# Patient Record
Sex: Male | Born: 1985 | Hispanic: Yes | Marital: Single | Smoking: Light tobacco smoker
Health system: Southern US, Community
[De-identification: ages and names within clinical notes are randomized; demographics above are authoritative.]

---

## 2017-08-30 ENCOUNTER — Emergency Department (HOSPITAL_COMMUNITY): Payer: No Typology Code available for payment source

## 2017-08-30 ENCOUNTER — Emergency Department (HOSPITAL_COMMUNITY)
Admission: EM | Admit: 2017-08-30 | Discharge: 2017-08-31 | Disposition: A | Discharge status: Home / Self Care | Payer: No Typology Code available for payment source | Attending: Emergency Medicine | Admitting: Emergency Medicine

## 2017-08-30 ENCOUNTER — Other Ambulatory Visit: Payer: Self-pay

## 2017-08-30 ENCOUNTER — Encounter (HOSPITAL_COMMUNITY): Payer: Self-pay

## 2017-08-30 DIAGNOSIS — Y999 Unspecified external cause status: Secondary | ICD-10-CM | POA: Insufficient documentation

## 2017-08-30 DIAGNOSIS — Y92410 Unspecified street and highway as the place of occurrence of the external cause: Secondary | ICD-10-CM | POA: Insufficient documentation

## 2017-08-30 DIAGNOSIS — S0990XA Unspecified injury of head, initial encounter: Secondary | ICD-10-CM | POA: Diagnosis present

## 2017-08-30 DIAGNOSIS — S0083XA Contusion of other part of head, initial encounter: Secondary | ICD-10-CM

## 2017-08-30 DIAGNOSIS — S3981XA Other specified injuries of abdomen, initial encounter: Secondary | ICD-10-CM | POA: Diagnosis not present

## 2017-08-30 DIAGNOSIS — S40011A Contusion of right shoulder, initial encounter: Secondary | ICD-10-CM | POA: Diagnosis not present

## 2017-08-30 DIAGNOSIS — S20219A Contusion of unspecified front wall of thorax, initial encounter: Secondary | ICD-10-CM

## 2017-08-30 DIAGNOSIS — F17200 Nicotine dependence, unspecified, uncomplicated: Secondary | ICD-10-CM | POA: Insufficient documentation

## 2017-08-30 DIAGNOSIS — Y9389 Activity, other specified: Secondary | ICD-10-CM | POA: Insufficient documentation

## 2017-08-30 DIAGNOSIS — S3991XA Unspecified injury of abdomen, initial encounter: Secondary | ICD-10-CM

## 2017-08-30 DIAGNOSIS — R079 Chest pain, unspecified: Secondary | ICD-10-CM | POA: Insufficient documentation

## 2017-08-30 LAB — CBC WITH DIFFERENTIAL/PLATELET
Basophils Absolute: 0 10*3/uL (ref 0.0–0.1)
Basophils Relative: 0 %
Eosinophils Absolute: 0.3 10*3/uL (ref 0.0–0.7)
Eosinophils Relative: 2 %
HEMATOCRIT: 42.2 % (ref 39.0–52.0)
Hemoglobin: 13.9 g/dL (ref 13.0–17.0)
LYMPHS ABS: 1.2 10*3/uL (ref 0.7–4.0)
LYMPHS PCT: 10 %
MCH: 30.5 pg (ref 26.0–34.0)
MCHC: 32.9 g/dL (ref 30.0–36.0)
MCV: 92.7 fL (ref 78.0–100.0)
MONOS PCT: 9 %
Monocytes Absolute: 1.1 10*3/uL — ABNORMAL HIGH (ref 0.1–1.0)
NEUTROS ABS: 9.8 10*3/uL — AB (ref 1.7–7.7)
Neutrophils Relative %: 79 %
Platelets: 168 10*3/uL (ref 150–400)
RBC: 4.55 MIL/uL (ref 4.22–5.81)
RDW: 13.3 % (ref 11.5–15.5)
WBC: 12.4 10*3/uL — ABNORMAL HIGH (ref 4.0–10.5)

## 2017-08-30 LAB — TYPE AND SCREEN
ABO/RH(D): O POS
Antibody Screen: NEGATIVE

## 2017-08-30 LAB — ETHANOL: Alcohol, Ethyl (B): <10 mg/dL (ref <10)

## 2017-08-30 LAB — ABO/RH: ABO/RH(D): O POS

## 2017-08-30 MED ORDER — MORPHINE SULFATE (PF) 4 MG/ML IV SOLN
4.0000 mg | Freq: Once | INTRAVENOUS | Status: AC
  Administered 2017-08-30: 4 mg via INTRAVENOUS
  Filled 2017-08-30: qty 1

## 2017-08-30 MED ORDER — SODIUM CHLORIDE 0.9 % IV SOLN
Freq: Once | INTRAVENOUS | Status: AC
  Administered 2017-08-30: 21:00:00 via INTRAVENOUS

## 2017-08-30 MED ORDER — KETOROLAC TROMETHAMINE 30 MG/ML IJ SOLN
30.0000 mg | Freq: Once | INTRAMUSCULAR | Status: AC
  Administered 2017-08-31: 30 mg via INTRAVENOUS
  Filled 2017-08-30: qty 1

## 2017-08-30 MED ORDER — IBUPROFEN 800 MG PO TABS: 800.0000 mg | ORAL_TABLET | Freq: Three times a day (TID) | ORAL | 0 refills | Status: AC

## 2017-08-30 MED ORDER — IOPAMIDOL (ISOVUE-300) INJECTION 61%
INTRAVENOUS | Status: AC
  Administered 2017-08-30: 100 mL via INTRAVENOUS
  Filled 2017-08-30: qty 100

## 2017-08-30 MED ORDER — CYCLOBENZAPRINE HCL 10 MG PO TABS: 10.0000 mg | ORAL_TABLET | Freq: Three times a day (TID) | ORAL | 0 refills | Status: AC
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
## 2017-08-30 NOTE — ED Notes (Signed)
Pt. To CT via stretcher. 
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
## 2017-08-30 NOTE — ED Notes (Signed)
X RAY at bedside 
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
